# Patient Record
Sex: Male | Born: 1935 | Race: White | Hispanic: No | Smoking: Never smoker
Health system: Southern US, Community
[De-identification: ages and names within clinical notes are randomized; demographics above are authoritative.]

## PROBLEM LIST (undated history)

## (undated) DIAGNOSIS — E119 Type 2 diabetes mellitus without complications: Secondary | ICD-10-CM

## (undated) DIAGNOSIS — I10 Essential (primary) hypertension: Secondary | ICD-10-CM

## (undated) DIAGNOSIS — M109 Gout, unspecified: Secondary | ICD-10-CM

## (undated) DIAGNOSIS — N189 Chronic kidney disease, unspecified: Secondary | ICD-10-CM

## (undated) HISTORY — PX: EYE SURGERY: SHX253

---

## 2013-03-29 ENCOUNTER — Encounter (HOSPITAL_COMMUNITY): Payer: Self-pay | Admitting: Emergency Medicine

## 2013-03-29 ENCOUNTER — Emergency Department (HOSPITAL_COMMUNITY): Payer: Medicare Other

## 2013-03-29 ENCOUNTER — Inpatient Hospital Stay (HOSPITAL_COMMUNITY)
Admission: EM | Admit: 2013-03-29 | Discharge: 2013-04-01 | DRG: 638 | Disposition: A | Discharge status: Home / Self Care | Payer: Medicare Other | Attending: Internal Medicine | Admitting: Internal Medicine

## 2013-03-29 DIAGNOSIS — M9080 Osteopathy in diseases classified elsewhere, unspecified site: Secondary | ICD-10-CM | POA: Diagnosis present

## 2013-03-29 DIAGNOSIS — M869 Osteomyelitis, unspecified: Secondary | ICD-10-CM

## 2013-03-29 DIAGNOSIS — E1169 Type 2 diabetes mellitus with other specified complication: Principal | ICD-10-CM | POA: Diagnosis present

## 2013-03-29 DIAGNOSIS — D649 Anemia, unspecified: Secondary | ICD-10-CM

## 2013-03-29 DIAGNOSIS — I10 Essential (primary) hypertension: Secondary | ICD-10-CM

## 2013-03-29 DIAGNOSIS — Z66 Do not resuscitate: Secondary | ICD-10-CM | POA: Diagnosis present

## 2013-03-29 DIAGNOSIS — I129 Hypertensive chronic kidney disease with stage 1 through stage 4 chronic kidney disease, or unspecified chronic kidney disease: Secondary | ICD-10-CM | POA: Diagnosis present

## 2013-03-29 DIAGNOSIS — D72829 Elevated white blood cell count, unspecified: Secondary | ICD-10-CM

## 2013-03-29 DIAGNOSIS — N183 Chronic kidney disease, stage 3 unspecified: Secondary | ICD-10-CM

## 2013-03-29 DIAGNOSIS — T380X5A Adverse effect of glucocorticoids and synthetic analogues, initial encounter: Secondary | ICD-10-CM | POA: Diagnosis present

## 2013-03-29 DIAGNOSIS — M7989 Other specified soft tissue disorders: Secondary | ICD-10-CM | POA: Diagnosis present

## 2013-03-29 DIAGNOSIS — M109 Gout, unspecified: Secondary | ICD-10-CM

## 2013-03-29 HISTORY — DX: Type 2 diabetes mellitus without complications: E11.9

## 2013-03-29 HISTORY — DX: Gout, unspecified: M10.9

## 2013-03-29 HISTORY — DX: Essential (primary) hypertension: I10

## 2013-03-29 HISTORY — DX: Chronic kidney disease, unspecified: N18.9

## 2013-03-29 LAB — CBC WITH DIFFERENTIAL/PLATELET
Basophils Absolute: 0.1 10*3/uL (ref 0.0–0.1)
Basophils Relative: 1 % (ref 0–1)
Eosinophils Absolute: 0.3 10*3/uL (ref 0.0–0.7)
Eosinophils Relative: 4 % (ref 0–5)
HCT: 35.4 % — ABNORMAL LOW (ref 39.0–52.0)
Hemoglobin: 12.1 g/dL — ABNORMAL LOW (ref 13.0–17.0)
Lymphocytes Relative: 21 % (ref 12–46)
Lymphs Abs: 2.1 10*3/uL (ref 0.7–4.0)
MCH: 32.2 pg (ref 26.0–34.0)
MCHC: 34.2 g/dL (ref 30.0–36.0)
MCV: 94.1 fL (ref 78.0–100.0)
Monocytes Absolute: 0.9 10*3/uL (ref 0.1–1.0)
Monocytes Relative: 9 % (ref 3–12)
NEUTROS ABS: 6.4 10*3/uL (ref 1.7–7.7)
Neutrophils Relative %: 65 % (ref 43–77)
PLATELETS: 254 10*3/uL (ref 150–400)
RBC: 3.76 MIL/uL — AB (ref 4.22–5.81)
RDW: 14.8 % (ref 11.5–15.5)
WBC: 9.8 10*3/uL (ref 4.0–10.5)

## 2013-03-29 LAB — BASIC METABOLIC PANEL
BUN: 32 mg/dL — ABNORMAL HIGH (ref 6–23)
CHLORIDE: 102 meq/L (ref 96–112)
CO2: 22 mEq/L (ref 19–32)
Calcium: 9.4 mg/dL (ref 8.4–10.5)
Creatinine, Ser: 1.42 mg/dL — ABNORMAL HIGH (ref 0.50–1.35)
GFR calc Af Amer: 51 mL/min — ABNORMAL LOW (ref >90)
GFR, EST NON AFRICAN AMERICAN: 44 mL/min — AB (ref >90)
GLUCOSE: 202 mg/dL — AB (ref 70–99)
POTASSIUM: 3.5 meq/L — AB (ref 3.7–5.3)
SODIUM: 140 meq/L (ref 137–147)

## 2013-03-29 MED ORDER — VANCOMYCIN HCL 10 G IV SOLR
1500.0000 mg | INTRAVENOUS | Status: AC
  Administered 2013-03-30: 1500 mg via INTRAVENOUS
  Filled 2013-03-29: qty 1500

## 2013-03-29 MED ORDER — ONDANSETRON HCL 4 MG/2ML IJ SOLN
4.0000 mg | Freq: Four times a day (QID) | INTRAMUSCULAR | Status: DC | PRN
  Administered 2013-03-30 – 2013-03-31 (×2): 4 mg via INTRAVENOUS
  Filled 2013-03-29 (×2): qty 2

## 2013-03-29 MED ORDER — VANCOMYCIN HCL 10 G IV SOLR
1500.0000 mg | Freq: Once | INTRAVENOUS | Status: DC
  Filled 2013-03-29: qty 1500

## 2013-03-29 MED ORDER — HYDROCODONE-ACETAMINOPHEN 5-325 MG PO TABS
1.0000 | ORAL_TABLET | ORAL | Status: DC | PRN
  Administered 2013-03-30 (×2): 2 via ORAL
  Administered 2013-03-30: 1 via ORAL
  Administered 2013-03-30 – 2013-03-31 (×5): 2 via ORAL
  Administered 2013-03-31 – 2013-04-01 (×2): 1 via ORAL
  Filled 2013-03-29 (×4): qty 2
  Filled 2013-03-29: qty 1
  Filled 2013-03-29 (×4): qty 2
  Filled 2013-03-29: qty 1

## 2013-03-29 MED ORDER — HEPARIN SODIUM (PORCINE) 5000 UNIT/ML IJ SOLN
5000.0000 [IU] | Freq: Three times a day (TID) | INTRAMUSCULAR | Status: DC
Start: 2013-03-30 — End: 2013-04-01
  Administered 2013-03-30 – 2013-04-01 (×7): 5000 [IU] via SUBCUTANEOUS
  Filled 2013-03-29 (×11): qty 1

## 2013-03-29 MED ORDER — PROPRANOLOL HCL 20 MG PO TABS
20.0000 mg | ORAL_TABLET | Freq: Two times a day (BID) | ORAL | Status: DC
  Administered 2013-03-30 – 2013-04-01 (×5): 20 mg via ORAL
  Filled 2013-03-29 (×8): qty 1

## 2013-03-29 MED ORDER — ONDANSETRON HCL 4 MG PO TABS
4.0000 mg | ORAL_TABLET | Freq: Four times a day (QID) | ORAL | Status: DC | PRN
  Administered 2013-03-31 – 2013-04-01 (×3): 4 mg via ORAL
  Filled 2013-03-29 (×3): qty 1

## 2013-03-29 MED ORDER — DEXTROSE 5 % IV SOLN
2.0000 g | Freq: Once | INTRAVENOUS | Status: DC
  Filled 2013-03-29: qty 2

## 2013-03-29 MED ORDER — VANCOMYCIN HCL 10 G IV SOLR
1500.0000 mg | INTRAVENOUS | Status: DC
  Filled 2013-03-29 (×2): qty 1500

## 2013-03-29 NOTE — ED Provider Notes (Signed)
CSN: [id number]     Arrival date & time 03/29/13  1600 History   First MD Initiated Contact with Patient 03/29/13 2032     Chief Complaint  Patient presents with  . Foot Pain     (Consider location/radiation/quality/duration/timing/severity/associated sxs/prior Treatment) Patient is a 80 y.o. male presenting with lower extremity pain. The history is provided by the patient.  Foot Pain This is a new problem. Episode onset: 2-3 weeks ago. The problem occurs constantly. The problem has not changed since onset.Pertinent negatives include no chest pain, no abdominal pain, no headaches and no shortness of breath. Exacerbated by: hit the 2nd digit of his right foot several weeks ago. Nothing relieves the symptoms. He has tried nothing for the symptoms. The treatment provided no relief.    Past Medical History  Diagnosis Date  . Gout   . Hypertension    History reviewed. No pertinent past surgical history. History reviewed. No pertinent family history. History  Substance Use Topics  . Smoking status: Never Smoker   . Smokeless tobacco: Current User    Types: Chew  . Alcohol Use: Yes    Review of Systems  Constitutional: Negative for fever.  HENT: Negative for drooling and rhinorrhea.   Eyes: Negative for pain.  Respiratory: Negative for cough and shortness of breath.   Cardiovascular: Negative for chest pain and leg swelling.  Gastrointestinal: Negative for nausea, vomiting, abdominal pain and diarrhea.  Genitourinary: Negative for dysuria and hematuria.  Musculoskeletal: Negative for gait problem and neck pain.  Skin: Negative for color change.  Neurological: Negative for numbness and headaches.  Hematological: Negative for adenopathy.  Psychiatric/Behavioral: Negative for behavioral problems.  All other systems reviewed and are negative.      Allergies  Darvon  Home Medications   Current Outpatient Rx  Name  Route  Sig  Dispense  Refill  . indomethacin (INDOCIN) 25 MG  capsule   Oral   Take 25 mg by mouth 3 (three) times daily as needed (gout pain.).         Marland Kitchen lisinopril-hydrochlorothiazide (PRINZIDE,ZESTORETIC) 20-25 MG per tablet   Oral   Take 1 tablet by mouth daily.         . Misc Natural Products (TART CHERRY ADVANCED) CAPS   Oral   Take 1 capsule by mouth daily.         . propranolol (INDERAL) 20 MG tablet   Oral   Take 20 mg by mouth 2 (two) times daily.          BP 140/65  Pulse 69  Temp(Src) 98.5 F (36.9 C) (Oral)  Resp 18  SpO2 100% Physical Exam  Nursing note and vitals reviewed. Constitutional: He is oriented to person, place, and time. He appears well-developed and well-nourished.  HENT:  Head: Normocephalic and atraumatic.  Right Ear: External ear normal.  Left Ear: External ear normal.  Nose: Nose normal.  Mouth/Throat: Oropharynx is clear and moist. No oropharyngeal exudate.  Eyes: Conjunctivae and EOM are normal. Pupils are equal, round, and reactive to light.  Neck: Normal range of motion. Neck supple.  Cardiovascular: Normal rate, regular rhythm, normal heart sounds and intact distal pulses.  Exam reveals no gallop and no friction rub.   No murmur heard. Pulmonary/Chest: Effort normal and breath sounds normal. No respiratory distress. He has no wheezes.  Abdominal: Soft. Bowel sounds are normal. He exhibits no distension. There is no tenderness. There is no rebound and no guarding.  Musculoskeletal: Normal range of motion.  He exhibits no edema and no tenderness.  Diffuse erythema and swelling of the second digit on the right foot. A small punctate wound is noted on the right lateral aspect of the toe with mild purulent drainage.  Neurological: He is alert and oriented to person, place, and time.  Skin: Skin is warm and dry.  Psychiatric: He has a normal mood and affect. His behavior is normal.    ED Course  Procedures (including critical care time) Labs Review Labs Reviewed  CBC WITH DIFFERENTIAL -  Abnormal; Notable for the following:    RBC 3.76 (*)    Hemoglobin 12.1 (*)    HCT 35.4 (*)    All other components within normal limits  BASIC METABOLIC PANEL - Abnormal; Notable for the following:    Potassium 3.5 (*)    Glucose, Bld 202 (*)    BUN 32 (*)    Creatinine, Ser 1.42 (*)    GFR calc non Af Amer 44 (*)    GFR calc Af Amer 51 (*)    All other components within normal limits  SEDIMENTATION RATE - Abnormal; Notable for the following:    Sed Rate 66 (*)    All other components within normal limits  C-REACTIVE PROTEIN - Abnormal; Notable for the following:    CRP 2.9 (*)    All other components within normal limits  COMPREHENSIVE METABOLIC PANEL - Abnormal; Notable for the following:    Glucose, Bld 165 (*)    BUN 27 (*)    Albumin 3.2 (*)    GFR calc non Af Amer 49 (*)    GFR calc Af Amer 56 (*)    All other components within normal limits  CBC - Abnormal; Notable for the following:    WBC 11.1 (*)    RBC 4.04 (*)    Hemoglobin 12.7 (*)    HCT 37.9 (*)    All other components within normal limits  HEMOGLOBIN A1C - Abnormal; Notable for the following:    Hemoglobin A1C 6.1 (*)    Mean Plasma Glucose 128 (*)    All other components within normal limits  GLUCOSE, CAPILLARY - Abnormal; Notable for the following:    Glucose-Capillary 145 (*)    All other components within normal limits  CULTURE, BLOOD (ROUTINE X 2)  CULTURE, BLOOD (ROUTINE X 2)  PROTIME-INR   Imaging Review Dg Foot Complete Right  03/29/2013   CLINICAL DATA:  Trauma 2-3 weeks ago with bruising and swelling to the second toe  EXAM: RIGHT FOOT COMPLETE - 3+ VIEW  COMPARISON:  None.  FINDINGS: Destruction of the head of the second toe proximal phalanx is identified with internal fracture lines evident. Fluffy ossification/ calcification overlying this area is identified. Extensive vascular calcifications are noted. Periarticular erosions with bony overgrowth noted predominantly at the first  metatarsal-phalangeal joint. No radiopaque foreign body. Bones are subjectively osteopenic.  IMPRESSION: Osseous destruction at the right second PIP joint with fracture lines evident and findings highly suggestive of osteomyelitis.  Periarticular erosions most prominent at the right first metatarsal phalangeal joint as well as the first tarsometatarsal joint. This could be related to gout, neuropathic arthropathy, or multi focal osteomyelitis.   Electronically Signed   By: Christiana PellantGretchen  Green M.D.   On: 03/29/2013 18:07      MDM   Final diagnoses:  Osteomyelitis    9:07 PM 80 y.o. male who presents with toe pain which began several weeks ago. The patient believes that he may  have stubbed his toe at that time but does not remember any specific injury. He has noted purulent drainage from the last week with continued pain. He denies any fevers or other associated symptoms. He is afebrile and vital signs are unremarkable here. Will get screening labwork.  Plain film c/w osteomyelitis. Discussed w/ on call ortho. Will give IV abx and admit to [facility].     Junius Argyle, MD 03/31/13 1134

## 2013-03-29 NOTE — ED Notes (Signed)
Labs collected with IV start

## 2013-03-29 NOTE — ED Notes (Addendum)
Pt states he has had R toe pain for past 2 weeks since he bumped toe next to great toe. Family states she found out yesterday about toe. Went to [facility] this afternoon and was told to come here for eval. Pt's toe reddened and swollen. Cap refill under 2 seconds. Pt states pain has decreased since it began draining a few days ago

## 2013-03-29 NOTE — ED Notes (Signed)
Attempted to call report nurse unavailable, states she will call back.

## 2013-03-29 NOTE — Progress Notes (Signed)
   CARE MANAGEMENT ED NOTE 03/29/2013  Patient:  Frank [name],Frank [name]   Account Number:  [id number]  Date Initiated:  03/29/2013  Documentation initiated by:  Radford PaxFERRERO,Shadi Larner  Subjective/Objective Assessment:   Patient presents to Ed with right toe pain     Subjective/Objective Assessment Detail:   Patient with pmhx for gout anf HTN     Action/Plan:   Action/Plan Detail:   Anticipated DC Date:       Status Recommendation to Physician:   Result of Recommendation:    Other ED Services  Consult Working Plan    DC Planning Services  Other  PCP issues    Choice offered to / List presented to:            Status of service:  Completed, signed off  ED Comments:   ED Comments Detail:  EDCM spoke to patient and his daughter at bedside.  As per patient, [place] System updated.  Patient is currently staying with his daughter.

## 2013-03-29 NOTE — H&P (Signed)
[facility] History and Physical  Patient: Frank [name]  HFW:[id number]  DOB: 01-25-1928  DOS: the patient was seen and examined on 03/29/2013 PCP: Jobe Marker, MD  Chief Complaint: Swelling of left second toe with discharge  HPI: Frank [name] is a 80 y.o. male with Past medical history of gout, hypertension, chronic kidney disease. The patient is coming from home. The patient was brought in by his daughter, as per the patient he has been having complaints of pain in his second toe since last 3 weeks. Since last one week she has noted white-yellow discharge coming out of the second toe. He initially felt that this was his routine gout attack and took his ibuprofen. Few days ago he had bumped his toe and since then he has noted this discharge. He complains of generalized fatigue and tiredness but denies any fever, chills,  headache, cough, chest pain, palpitation, shortness of breath, orthopnea, PND, nausea, vomiting, abdominal pain, diarrhea, constipation, active bleeding, burning urination, dizziness, pedal edema,  focal neurological deficit. He denies any prior history of systemic infections or long-term antibiotics.   Review of Systems: as mentioned in the history of present illness.  A Comprehensive review of the other systems is negative.  Past Medical History  Diagnosis Date  . Gout   . Hypertension    History reviewed. No pertinent past surgical history. Social History:  reports that he has never smoked. His smokeless tobacco use includes Chew. He reports that he drinks alcohol. He reports that he does not use illicit drugs. Independent for most of his  ADL.  Allergies  Allergen Reactions  . Darvon [Propoxyphene] Swelling    History reviewed. No pertinent family history.  Prior to Admission medications   Medication Sig Start Date End Date Taking? Authorizing Provider  indomethacin (INDOCIN) 25 MG capsule Take 25 mg by mouth 3 (three) times daily as needed (gout  pain.).   Yes Historical Provider, MD  lisinopril-hydrochlorothiazide (PRINZIDE,ZESTORETIC) 20-25 MG per tablet Take 1 tablet by mouth daily.   Yes Historical Provider, MD  Misc Natural Products (TART CHERRY ADVANCED) CAPS Take 1 capsule by mouth daily.   Yes Historical Provider, MD  propranolol (INDERAL) 20 MG tablet Take 20 mg by mouth 2 (two) times daily.   Yes Historical Provider, MD    Physical Exam: Filed Vitals:   03/29/13 1644 03/29/13 2121  BP: 140/65   Pulse: 69   Temp: 98.5 F (36.9 C)   TempSrc: Oral   Resp: 18   Height:  6' (1.829 m)  Weight:  88.451 kg (195 lb)  SpO2: 100%     General: Alert, Awake and Oriented to Time, Place and Person. Appear in mild distress Eyes: PERRL ENT: Oral Mucosa clear moist. Neck: No JVD Cardiovascular: S1 and S2 Present, no Murmur, Peripheral Pulses Present Respiratory: Bilateral Air entry equal and Decreased, Clear to Auscultation,  No Crackles, no wheezes Abdomen: Bowel Sound Present, Soft and Non tender Skin: No Rash Extremities: No Pedal edema, no calf tenderness, left second toe fusiform swelling with redness and warmth with some swelling on the dorsum of the foot. Peripheral pulses are palpable. Sensation is intact Neurologic: Grossly Unremarkable. Labs on Admission:  CBC:  Recent Labs Lab 03/29/13 2119  WBC 9.8  NEUTROABS 6.4  HGB 12.1*  HCT 35.4*  MCV 94.1  PLT 254    CMP     Component Value Date/Time   NA 140 03/29/2013 2119   K 3.5* 03/29/2013 2119   CL 102 03/29/2013 2119  CO2 22 03/29/2013 2119   GLUCOSE 202* 03/29/2013 2119   BUN 32* 03/29/2013 2119   CREATININE 1.42* 03/29/2013 2119   CALCIUM 9.4 03/29/2013 2119   GFRNONAA 44* 03/29/2013 2119   GFRAA 51* 03/29/2013 2119    No results found for this basename: LIPASE, AMYLASE,  in the last 168 hours No results found for this basename: AMMONIA,  in the last 168 hours  No results found for this basename: CKTOTAL, CKMB, CKMBINDEX, TROPONINI,  in the last 168 hours BNP  (last 3 results) No results found for this basename: PROBNP,  in the last 8760 hours  Radiological Exams on Admission: Dg Foot Complete Right  03/29/2013   CLINICAL DATA:  Trauma 2-3 weeks ago with bruising and swelling to the second toe  EXAM: RIGHT FOOT COMPLETE - 3+ VIEW  COMPARISON:  None.  FINDINGS: Destruction of the head of the second toe proximal phalanx is identified with internal fracture lines evident. Fluffy ossification/ calcification overlying this area is identified. Extensive vascular calcifications are noted. Periarticular erosions with bony overgrowth noted predominantly at the first metatarsal-phalangeal joint. No radiopaque foreign body. Bones are subjectively osteopenic.  IMPRESSION: Osseous destruction at the right second PIP joint with fracture lines evident and findings highly suggestive of osteomyelitis.  Periarticular erosions most prominent at the right first metatarsal phalangeal joint as well as the first tarsometatarsal joint. This could be related to gout, neuropathic arthropathy, or multi focal osteomyelitis.   Electronically Signed   By: Conchita Paris M.D.   On: 03/29/2013 18:07   Assessment/Plan Principal Problem:   Osteomyelitis Active Problems:   Gout   Hypertension   1. Osteomyelitis The patient is presenting with complaints of left second toe discharge and swelling. This has been ongoing since last 3 weeks. X-ray of the foot is suggestive of osteomyelitis. At present I would admit him to the hospital, start him on IV vancomycin, follow blood cultures, orthopedic has been consulted we'll follow recommendation. We will also get ESR CRP. Further workup as per orthopedic.  2. History of gout It appears the patient has history of gout and current condition appears secondarily infected gout joint At present he does not appear to have active gout continue to monitor  3. Hypertension Holding his diuretics and continuing propranolol as home dose  Consults:  Orthopedics  DVT Prophylaxis: subcutaneous Heparin Nutrition: Cardiac diet  Code Status: DNR/DNI  Family Communication: Daughter was present at bedside, opportunity was given to ask question and all questions were answered satisfactorily at the time of interview. Disposition: Admitted to observation in med-surge unit.  Author: Berle Mull, MD [facility] Pager: [phone] 03/29/2013, 11:13 PM    If 7PM-7AM, please contact night-coverage [email or web address] Password [email or web address]

## 2013-03-30 ENCOUNTER — Encounter (HOSPITAL_COMMUNITY)
Admission: EM | Disposition: A | Discharge status: Home / Self Care | Payer: Self-pay | Source: Home / Self Care | Attending: Internal Medicine

## 2013-03-30 ENCOUNTER — Encounter (HOSPITAL_COMMUNITY): Payer: Self-pay | Admitting: *Deleted

## 2013-03-30 DIAGNOSIS — I10 Essential (primary) hypertension: Secondary | ICD-10-CM | POA: Diagnosis present

## 2013-03-30 DIAGNOSIS — M109 Gout, unspecified: Secondary | ICD-10-CM | POA: Diagnosis present

## 2013-03-30 LAB — HEMOGLOBIN A1C
HEMOGLOBIN A1C: 6.1 % — AB (ref <5.7)
Mean Plasma Glucose: 128 mg/dL — ABNORMAL HIGH (ref <117)

## 2013-03-30 LAB — COMPREHENSIVE METABOLIC PANEL WITH GFR
ALT: 11 U/L (ref 0–53)
AST: 15 U/L (ref 0–37)
Albumin: 3.2 g/dL — ABNORMAL LOW (ref 3.5–5.2)
Alkaline Phosphatase: 75 U/L (ref 39–117)
BUN: 27 mg/dL — ABNORMAL HIGH (ref 6–23)
CO2: 20 meq/L (ref 19–32)
Calcium: 9.2 mg/dL (ref 8.4–10.5)
Chloride: 103 meq/L (ref 96–112)
Creatinine, Ser: 1.31 mg/dL (ref 0.50–1.35)
GFR calc Af Amer: 56 mL/min — ABNORMAL LOW
GFR calc non Af Amer: 49 mL/min — ABNORMAL LOW
Glucose, Bld: 165 mg/dL — ABNORMAL HIGH (ref 70–99)
Potassium: 3.7 meq/L (ref 3.7–5.3)
Sodium: 140 meq/L (ref 137–147)
Total Bilirubin: 0.7 mg/dL (ref 0.3–1.2)
Total Protein: 6.5 g/dL (ref 6.0–8.3)

## 2013-03-30 LAB — CBC
HCT: 37.9 % — ABNORMAL LOW (ref 39.0–52.0)
Hemoglobin: 12.7 g/dL — ABNORMAL LOW (ref 13.0–17.0)
MCH: 31.4 pg (ref 26.0–34.0)
MCHC: 33.5 g/dL (ref 30.0–36.0)
MCV: 93.8 fL (ref 78.0–100.0)
Platelets: 272 10*3/uL (ref 150–400)
RBC: 4.04 MIL/uL — ABNORMAL LOW (ref 4.22–5.81)
RDW: 14.8 % (ref 11.5–15.5)
WBC: 11.1 10*3/uL — ABNORMAL HIGH (ref 4.0–10.5)

## 2013-03-30 LAB — C-REACTIVE PROTEIN: CRP: 2.9 mg/dL — ABNORMAL HIGH (ref <0.60)

## 2013-03-30 LAB — PROTIME-INR
INR: 0.98 (ref 0.00–1.49)
Prothrombin Time: 12.8 seconds (ref 11.6–15.2)

## 2013-03-30 LAB — SEDIMENTATION RATE: Sed Rate: 66 mm/hr — ABNORMAL HIGH (ref 0–16)

## 2013-03-30 SURGERY — AMPUTATION DIGIT
Anesthesia: General | Laterality: Right

## 2013-03-30 MED ORDER — SODIUM CHLORIDE 0.9 % IV SOLN
INTRAVENOUS | Status: DC
  Administered 2013-03-30: 09:00:00 via INTRAVENOUS

## 2013-03-30 MED ORDER — DOXYCYCLINE HYCLATE 100 MG PO TABS
100.0000 mg | ORAL_TABLET | Freq: Two times a day (BID) | ORAL | Status: DC
  Administered 2013-03-31 – 2013-04-01 (×3): 100 mg via ORAL
  Filled 2013-03-30 (×4): qty 1

## 2013-03-30 MED ORDER — POLYETHYLENE GLYCOL 3350 17 G PO PACK: 17.0000 g | PACK | Freq: Every day | ORAL | Status: DC | PRN

## 2013-03-30 MED ORDER — INSULIN ASPART 100 UNIT/ML ~~LOC~~ SOLN
0.0000 [IU] | Freq: Three times a day (TID) | SUBCUTANEOUS | Status: DC
  Administered 2013-03-31: 3 [IU] via SUBCUTANEOUS
  Administered 2013-04-01 (×2): 2 [IU] via SUBCUTANEOUS

## 2013-03-30 MED ORDER — PREDNISONE 20 MG PO TABS
40.0000 mg | ORAL_TABLET | Freq: Every day | ORAL | Status: DC
  Administered 2013-03-31 – 2013-04-01 (×2): 40 mg via ORAL
  Filled 2013-03-30 (×4): qty 2

## 2013-03-30 MED ORDER — PNEUMOCOCCAL VAC POLYVALENT 25 MCG/0.5ML IJ INJ
0.5000 mL | INJECTION | INTRAMUSCULAR | Status: DC
  Filled 2013-03-30 (×2): qty 0.5

## 2013-03-30 MED ORDER — VANCOMYCIN HCL IN DEXTROSE 1-5 GM/200ML-% IV SOLN: 1000.0000 mg | INTRAVENOUS | Status: DC

## 2013-03-30 MED ORDER — VANCOMYCIN HCL IN DEXTROSE 750-5 MG/150ML-% IV SOLN
750.0000 mg | Freq: Two times a day (BID) | INTRAVENOUS | Status: DC
  Filled 2013-03-30: qty 150

## 2013-03-30 NOTE — Progress Notes (Signed)
Pharmacy: Brief Vancomycin Note  D#1 Vancomycin for OM toe.  Patients renal function mproved today with SCr 1.31 (1.42 last night) and normalized CrCl 36 ml/min will adjust vancomycin dose.  2/10 blood cultures pending.    Abx Plan 1.) Change Vancomycin to 1000 mg IV q24h, next dose due 2/11 at 0200  2.) Monitor renal function, will check BMET in am 3.) Vancomycin trough at Css 4.) f/u cultures   Lalonnie Shaffer, Loma MessingMary Patricia PharmD Pager #: [phone] 1:26 PM 03/30/2013

## 2013-03-30 NOTE — Progress Notes (Signed)
Called ED and got report from Amy, RN.

## 2013-03-30 NOTE — Progress Notes (Signed)
PROGRESS NOTE    Frank [name] ESP:[id number] DOB: [birth year] DOA: 03/29/2013 PCP: Frank Marker, MD  HPI/Brief narrative 80 year old male with history of gout, hypertension, chronic kidney disease presented from home with complaints of pain in his right second toe of 3 weeks duration associated with whitish yellow discharge. He thought this was his routine gout attack and took ibuprofen. Subsequently he bumped his toe and then noted the discharge. X-ray of the foot was suggestive of osteomyelitis and patient was admitted for further management.  Assessment/Plan:  Right second toe acute gouty arthritis versus infected Gout - Orthopedic input appreciated. Recommend conservatively with antibiotics and gout medications followed by outpatient followup for further evaluation. - Continue IV vancomycin for tonight in addition to oral prednisone. May switch to oral doxycycline and discharged on home in a.m. on short course of prednisone. Outpatient followup with orthopedics in one week. - ESR 66  Hypertension - Controlled  Chronic kidney disease - Stable.   Code Status: DO NOT RESUSCITATE Family Communication: None at bedside Disposition Plan: Home possibly tomorrow   Consultants:  Orthopedics  Procedures:  None  Antibiotics:  IV vancomycin   Subjective: Right second toe pain-slightly better than yesterday.  Objective: Filed Vitals:   03/30/13 0100 03/30/13 0550 03/30/13 0909 03/30/13 1400  BP:  109/66 123/59 121/60  Pulse: 68 94 96 63  Temp:  98.9 F (37.2 C) 99.4 F (37.4 C) 98.5 F (36.9 C)  TempSrc:  Oral Oral Oral  Resp:  _0 Height:      Weight:      SpO2:  94% 96% 96%    Intake/Output Summary (Last 24 hours) at 03/30/13 1837 Last data filed at 03/30/13 0550  Gross per 24 hour  Intake   1005 ml  Output      0 ml  Net   1005 ml   Filed Weights   03/29/13 2121 03/30/13 0035  Weight: 88.451 kg (195 lb) 88.451 kg (195 lb)     Exam:  General  exam: Elderly male lying comfortably in bed. Respiratory system: Clear. No increased work of breathing. Cardiovascular system: S1 & S2 heard, RRR. No JVD, murmurs, gallops, clicks or pedal edema. Gastrointestinal system: Abdomen is nondistended, soft and nontender. Normal bowel sounds heard. Central nervous system: Alert and oriented. No focal neurological deficits. Extremities: Symmetric 5 x 5 power. Right second toe diffusely swollen, slightly warm, tender and extruding minimal chalky white material.   Data Reviewed: Basic Metabolic Panel:  Recent Labs Lab 03/29/13 2119 03/30/13 0440  NA 140 140  K 3.5* 3.7  CL 102 103  CO2 22 20  GLUCOSE 202* 165*  BUN 32* 27*  CREATININE 1.42* 1.31  CALCIUM 9.4 9.2   Liver Function Tests:  Recent Labs Lab 03/30/13 0440  AST 15  ALT 11  ALKPHOS 75  BILITOT 0.7  PROT 6.5  ALBUMIN 3.2*   No results found for this basename: LIPASE, AMYLASE,  in the last 168 hours No results found for this basename: AMMONIA,  in the last 168 hours CBC:  Recent Labs Lab 03/29/13 2119 03/30/13 0440  WBC 9.8 11.1*  NEUTROABS 6.4  --   HGB 12.1* 12.7*  HCT 35.4* 37.9*  MCV 94.1 93.8  PLT 254 272   Cardiac Enzymes: No results found for this basename: CKTOTAL, CKMB, CKMBINDEX, TROPONINI,  in the last 168 hours BNP (last 3 results) No results found for this basename: PROBNP,  in the last 8760 hours CBG: No results found for  this basename: GLUCAP,  in the last 168 hours  No results found for this or any previous visit (from the past 240 hour(s)).    Studies: Dg Foot Complete Right  2013-03-30   CLINICAL DATA:  Trauma 2-3 weeks ago with bruising and swelling to the second toe  EXAM: RIGHT FOOT COMPLETE - 3+ VIEW  COMPARISON:  None.  FINDINGS: Destruction of the head of the second toe proximal phalanx is identified with internal fracture lines evident. Fluffy ossification/ calcification overlying this area is identified. Extensive vascular  calcifications are noted. Periarticular erosions with bony overgrowth noted predominantly at the first metatarsal-phalangeal joint. No radiopaque foreign body. Bones are subjectively osteopenic.  IMPRESSION: Osseous destruction at the right second PIP joint with fracture lines evident and findings highly suggestive of osteomyelitis.  Periarticular erosions most prominent at the right first metatarsal phalangeal joint as well as the first tarsometatarsal joint. This could be related to gout, neuropathic arthropathy, or multi focal osteomyelitis.   Electronically Signed   By: Conchita Paris M.D.   On: 2013/03/30 18:07        Scheduled Meds: . heparin  5,000 Units Subcutaneous Q8H  . [START ON 03/31/2013] pneumococcal 23 valent vaccine  0.5 mL Intramuscular Tomorrow-1000  . propranolol  20 mg Oral BID  . [START ON 03/31/2013] vancomycin  1,000 mg Intravenous Q24H   Continuous Infusions: . sodium chloride 100 mL/hr at 03/30/13 4445    Principal Problem:   Osteomyelitis Active Problems:   Gout   Hypertension    Time spent: 65 minutes    Frank Hands, MD, FACP, FHM. [facility] Pager [phone]  If 7PM-7AM, please contact night-coverage [email or web address] Password [facility] 03/30/2013, 6:37 PM    LOS: 1 day

## 2013-03-30 NOTE — Treatment Plan (Signed)
Will plan to medically optimize patient with gout meds and abx before undergoing surgery. May dc home and follow up with me in 1 week for clinical re check. Does ultimately need toe amputation with the amount of bony destruction but does not necessarily need surgery acutely.  Mayra ReelN. Michael Xu, MD [facility] [phone] 2:10 PM

## 2013-03-30 NOTE — Progress Notes (Signed)
UR completed. Patient changed to inpatient- requiring IV antibiotics for osteomyelitis.

## 2013-03-30 NOTE — Consult Note (Signed)
ORTHOPAEDIC CONSULTATION  REQUESTING PHYSICIAN: Elease Etienne, MD  Chief Complaint: Toe swelling and drainage  HPI: Frank [name] is a 80 y.o. male who complains of right second toe swelling, pain, drainage for a couple of weeks.  Denies any constitutional symptoms.  Has h/o gout.  Daughter reports white cottage cheese like drainage from lateral wound.  Denies DM or peripheral neuropathy.  Past Medical History  Diagnosis Date  . Gout   . Hypertension   . Gout   . Diabetes mellitus without complication     borderline a few years but has lost weight  . Chronic kidney disease    Past Surgical History  Procedure Laterality Date  . Eye surgery Bilateral     cataracts   History   Social History  . Marital Status: Widowed    Spouse Name: N/A    Number of Children: N/A  . Years of Education: N/A   Social History Main Topics  . Smoking status: Never Smoker   . Smokeless tobacco: Current User    Types: Chew  . Alcohol Use: Yes     Comment: vodka rarely, last on 2/9  . Drug Use: No  . Sexual Activity: No   Other Topics Concern  . None   Social History Narrative  . None   Family History  Problem Relation Age of Onset  . Cancer Brother    Allergies  Allergen Reactions  . Darvon [Propoxyphene] Swelling   Prior to Admission medications   Medication Sig Start Date End Date Taking? Authorizing Provider  indomethacin (INDOCIN) 25 MG capsule Take 25 mg by mouth 3 (three) times daily as needed (gout pain.).   Yes Historical Provider, MD  lisinopril-hydrochlorothiazide (PRINZIDE,ZESTORETIC) 20-25 MG per tablet Take 1 tablet by mouth daily.   Yes Historical Provider, MD  Misc Natural Products (TART CHERRY ADVANCED) CAPS Take 1 capsule by mouth daily.   Yes Historical Provider, MD  propranolol (INDERAL) 20 MG tablet Take 20 mg by mouth 2 (two) times daily.   Yes Historical Provider, MD   Dg Foot Complete Right  03/29/2013   CLINICAL DATA:  Trauma 2-3 weeks ago with  bruising and swelling to the second toe  EXAM: RIGHT FOOT COMPLETE - 3+ VIEW  COMPARISON:  None.  FINDINGS: Destruction of the head of the second toe proximal phalanx is identified with internal fracture lines evident. Fluffy ossification/ calcification overlying this area is identified. Extensive vascular calcifications are noted. Periarticular erosions with bony overgrowth noted predominantly at the first metatarsal-phalangeal joint. No radiopaque foreign body. Bones are subjectively osteopenic.  IMPRESSION: Osseous destruction at the right second PIP joint with fracture lines evident and findings highly suggestive of osteomyelitis.  Periarticular erosions most prominent at the right first metatarsal phalangeal joint as well as the first tarsometatarsal joint. This could be related to gout, neuropathic arthropathy, or multi focal osteomyelitis.   Electronically Signed   By: Christiana Pellant M.D.   On: 03/29/2013 18:07    Positive ROS: All other systems have been reviewed and were otherwise negative with the exception of those mentioned in the HPI and as above.  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 Infected gout  Plan: - discussed findings with patient and daughter - with destruction of bony architecture and possibly infected gouty toe, would recommend surgical treatment - discussed I&D vs. Toe amp - would recommend toe amp - NPO - plan for surgery this pm - r/b/a discussed with  patient and daughter  Thank you for the consult and the opportunity to see Frank [name]  N. Glee ArvinMichael Xu, MD [facility] [phone] 9:21 AM

## 2013-03-30 NOTE — Progress Notes (Signed)
ANTIBIOTIC CONSULT NOTE - INITIAL  Pharmacy Consult for vancomycin Indication: Osteomyelitis  Allergies  Allergen Reactions  . Darvon [Propoxyphene] Swelling    Patient Measurements: Height: 6' (182.9 cm) Weight: 195 lb (88.451 kg) IBW/kg (Calculated) : 77.6 Adjusted Body Weight:   Vital Signs: Temp: 98.1 F (36.7 C) (02/10 0035) Temp src: Oral (02/10 0035) BP: 137/77 mmHg (02/10 0035) Pulse Rate: 77 (02/10 0035) Intake/Output from previous day: 02/09 0701 - 02/10 0700 In: 120 [P.O.:120] Out: -  Intake/Output from this shift: Total I/O In: 120 [P.O.:120] Out: -   Labs:  Recent Labs  03/29/13 2119  WBC 9.8  HGB 12.1*  PLT 254  CREATININE 1.42*   Estimated Creatinine Clearance: 43.3 ml/min (by C-G formula based on Cr of 1.42). No results found for this basename: VANCOTROUGH, VANCOPEAK, VANCORANDOM, GENTTROUGH, GENTPEAK, GENTRANDOM, TOBRATROUGH, TOBRAPEAK, TOBRARND, AMIKACINPEAK, AMIKACINTROU, AMIKACIN,  in the last 72 hours   Microbiology: No results found for this or any previous visit (from the past 720 hour(s)).  Medical History: Past Medical History  Diagnosis Date  . Gout   . Hypertension   . Gout   . Diabetes mellitus without complication     borderline a few years     Medications:  Anti-infectives   Start     Dose/Rate Route Frequency Ordered Stop   03/30/13 1800  vancomycin (VANCOCIN) IVPB 750 mg/150 ml premix     750 mg 150 mL/hr over 60 Minutes Intravenous Every 12 hours 03/30/13 0301     03/29/13 2345  vancomycin (VANCOCIN) 1,500 mg in sodium chloride 0.9 % 500 mL IVPB     1,500 mg 250 mL/hr over 120 Minutes Intravenous STAT 03/29/13 2338 03/30/13 0207   03/29/13 2300  vancomycin (VANCOCIN) 1,500 mg in sodium chloride 0.9 % 500 mL IVPB  Status:  Discontinued     1,500 mg 250 mL/hr over 120 Minutes Intravenous STAT 03/29/13 2252 03/30/13 0100   03/29/13 2130  vancomycin (VANCOCIN) 1,500 mg in sodium chloride 0.9 % 500 mL IVPB  Status:   Discontinued     1,500 mg 250 mL/hr over 120 Minutes Intravenous  Once 03/29/13 2106 03/29/13 2111   03/29/13 2115  cefTRIAXone (ROCEPHIN) 2 g in dextrose 5 % 50 mL IVPB  Status:  Discontinued     2 g 100 mL/hr over 30 Minutes Intravenous  Once 03/29/13 2106 03/29/13 2111     Assessment: Patient with osteomyelitis.  First dose of antibiotics already given.  Goal of Therapy:  Vancomycin trough level 15-20 mcg/ml  Plan:  Measure antibiotic drug levels at steady state Follow up culture results Vancomycin 750mg  iv q12hr, next dose at [address]  Fransheska Willingham Jr, RussellJulian Crowford 03/30/2013,3:02 AM

## 2013-03-30 NOTE — Progress Notes (Signed)
Patient admitted to room 1609 from ED. VSS. No complaints at present.

## 2013-03-31 ENCOUNTER — Inpatient Hospital Stay (HOSPITAL_COMMUNITY): Payer: Medicare Other

## 2013-03-31 DIAGNOSIS — D649 Anemia, unspecified: Secondary | ICD-10-CM

## 2013-03-31 DIAGNOSIS — M109 Gout, unspecified: Secondary | ICD-10-CM

## 2013-03-31 DIAGNOSIS — N183 Chronic kidney disease, stage 3 unspecified: Secondary | ICD-10-CM

## 2013-03-31 DIAGNOSIS — D72829 Elevated white blood cell count, unspecified: Secondary | ICD-10-CM

## 2013-03-31 LAB — SYNOVIAL CELL COUNT + DIFF, W/ CRYSTALS
Eosinophils-Synovial: 0 % (ref 0–1)
Eosinophils-Synovial: 0 % (ref 0–1)
LYMPHOCYTES-SYNOVIAL FLD: 1 % (ref 0–20)
Lymphocytes-Synovial Fld: 0 % (ref 0–20)
Monocyte-Macrophage-Synovial Fluid: 11 % — ABNORMAL LOW (ref 50–90)
Monocyte-Macrophage-Synovial Fluid: 16 % — ABNORMAL LOW (ref 50–90)
NEUTROPHIL, SYNOVIAL: 89 % — AB (ref 0–25)
Neutrophil, Synovial: 83 % — ABNORMAL HIGH (ref 0–25)
WBC, SYNOVIAL: 17700 /mm3 — AB (ref 0–200)
WBC, Synovial: UNDETERMINED /mm3 (ref 0–200)

## 2013-03-31 LAB — GLUCOSE, CAPILLARY
GLUCOSE-CAPILLARY: 150 mg/dL — AB (ref 70–99)
Glucose-Capillary: 145 mg/dL — ABNORMAL HIGH (ref 70–99)
Glucose-Capillary: 219 mg/dL — ABNORMAL HIGH (ref 70–99)
Glucose-Capillary: 251 mg/dL — ABNORMAL HIGH (ref 70–99)

## 2013-03-31 LAB — URIC ACID: Uric Acid, Serum: 9 mg/dL — ABNORMAL HIGH (ref 4.0–7.8)

## 2013-03-31 MED ORDER — COLCHICINE 0.6 MG PO TABS
1.2000 mg | ORAL_TABLET | Freq: Once | ORAL | Status: AC
  Administered 2013-03-31: 1.2 mg via ORAL
  Filled 2013-03-31: qty 2

## 2013-03-31 MED ORDER — TRIAMCINOLONE ACETONIDE 40 MG/ML IJ SUSP
40.0000 mg | Freq: Once | INTRAMUSCULAR | Status: DC
  Filled 2013-03-31: qty 1

## 2013-03-31 MED ORDER — COLCHICINE 0.6 MG PO TABS
0.6000 mg | ORAL_TABLET | Freq: Two times a day (BID) | ORAL | Status: DC
Start: 2013-03-31 — End: 2013-04-01
  Administered 2013-03-31 – 2013-04-01 (×2): 0.6 mg via ORAL
  Filled 2013-03-31 (×3): qty 1

## 2013-03-31 MED ORDER — LIDOCAINE HCL 1 % IJ SOLN
30.0000 mL | Freq: Once | INTRAMUSCULAR | Status: AC
  Administered 2013-03-31: 30 mL via INTRADERMAL
  Filled 2013-03-31: qty 30

## 2013-03-31 NOTE — Progress Notes (Addendum)
[facility] PROGRESS NOTE  Frank [name] BVQ:[id number] DOB: [birth year] DOA: 03/29/2013 PCP: Jobe Marker, MD  Assessment/Plan  Right second toe acute gouty arthritis versus infected Gout  - Orthopedic input appreciated.  -  Continue doxycycline  -  Followup with orthopedics in one week  -  Start colchicine -  Continue prednisone -  ESR 66, CRP 2.9 -  Uric acid -  Blood cultures no growth to date  Right knee pain with signs of arthritis, likely gouty, preventing patient from ambulating -  X-rays of the knees demonstrates effusion and swelling  -  Orthopedics to possibly perform aspiration with steroid injection, particularly of the right knee  -  PT evaluation   Hypertension, blood pressure stable, continue propranolol   Chronic kidney disease stage 3, creatinine stable   Mild normocytic anemia likely marrow suppression from acute inflammation  -  Repeat CBC in 2 week  Mild leukocytosis likely secondary to steroids, no sx to support PNA or UTI -  Repeat CBC in 2 weeks    Diet:  Diabetic  Access:  PIV  IVF:  Off  Proph:  Heparin   Code Status: DO NOT RESUSCITATE  Family Communication: Patient and his son-in-law  Disposition Plan: Pending improvement in pain, with the ability to ambulate and assist with transfers, likely to home probably with home health services as early as tomorrow    Consultants:  Orthopedics, Dr. Erlinda Hong  Procedures:  X-ray right foot  X-ray left knee  X-ray right knee   Antibiotics:  Vancomycin from 2/10x1 dose  Doxycycline 2/11   HPI/Subjective:  Swelling and pain of the second toe of the right foot is improved, and the toe is not draining as much currently. He now has severe right knee pain with swelling, warmth, and without redness. Pain is worse with movement and he is unable to bear weight on that leg.   Objective: Filed Vitals:   03/30/13 1400 03/30/13 2123 03/30/13 2135 03/31/13 0624  BP: 121/60  129/74 121/64  Pulse:  63 72 71 65  Temp: 98.5 F (36.9 C)  99.1 F (37.3 C) 98 F (36.7 C)  TempSrc: Oral  Oral Oral  Resp: $Remo'19  20 18  'MkRpk$ Height:      Weight:      SpO2: 96%  95% 95%    Intake/Output Summary (Last 24 hours) at 03/31/13 1421 Last data filed at 03/31/13 0900  Gross per 24 hour  Intake    858 ml  Output      0 ml  Net    858 ml   Filed Weights   03/29/13 2121 03/30/13 0035  Weight: 88.451 kg (195 lb) 88.451 kg (195 lb)    Exam:   General:  Caucasian male, No acute distress  HEENT:  NCAT, MMM  Cardiovascular:  RRR, nl S1, S2 no mrg, 2+ pulses, warm extremities  Respiratory:  CTAB, no increased WOB  Abdomen:   NABS, soft, NT/ND  MSK:   Normal tone and bulk, bilateral knees with crepitus and bony changes consistent with arthritis, right knee with a tense effusion and surrounding edema, no erythema, markedly warm compared to the left knee.  Range of motion limited by pain. Left knee with less swelling and effusion and warmth, but still somewhat painful with range of motion.  Second toe of the right foot is purplish and markedly swollen to about 4 times the normal size with a pinhead sized area that is leaking some whitish material on the fibular side of  the foot   Neuro:  Grossly intact  Data Reviewed: Basic Metabolic Panel:  Recent Labs Lab 03/29/13 2119 03/30/13 0440  NA 140 140  K 3.5* 3.7  CL 102 103  CO2 22 20  GLUCOSE 202* 165*  BUN 32* 27*  CREATININE 1.42* 1.31  CALCIUM 9.4 9.2   Liver Function Tests:  Recent Labs Lab 03/30/13 0440  AST 15  ALT 11  ALKPHOS 75  BILITOT 0.7  PROT 6.5  ALBUMIN 3.2*   No results found for this basename: LIPASE, AMYLASE,  in the last 168 hours No results found for this basename: AMMONIA,  in the last 168 hours CBC:  Recent Labs Lab 03/29/13 2119 03/30/13 0440  WBC 9.8 11.1*  NEUTROABS 6.4  --   HGB 12.1* 12.7*  HCT 35.4* 37.9*  MCV 94.1 93.8  PLT 254 272   Cardiac Enzymes: No results found for this basename:  CKTOTAL, CKMB, CKMBINDEX, TROPONINI,  in the last 168 hours BNP (last 3 results) No results found for this basename: PROBNP,  in the last 8760 hours CBG:  Recent Labs Lab 03/31/13 0723 03/31/13 1234  GLUCAP 145* 150*    Recent Results (from the past 240 hour(s))  CULTURE, BLOOD (ROUTINE X 2)     Status: None   Collection Time    03/30/13 12:02 AM      Result Value Ref Range Status   Specimen Description BLOOD RIGHT ANTECUBITAL   Final   Special Requests BOTTLES DRAWN AEROBIC AND ANAEROBIC 3CC   Final   Culture  Setup Time     Final   Value: 03/30/2013 03:28     Performed at [facility]   Culture     Final   Value:        BLOOD CULTURE RECEIVED NO GROWTH TO DATE CULTURE WILL BE HELD FOR 5 DAYS BEFORE ISSUING A FINAL NEGATIVE REPORT     Performed at [facility]   Report Status PENDING   Incomplete  CULTURE, BLOOD (ROUTINE X 2)     Status: None   Collection Time    03/30/13 12:02 AM      Result Value Ref Range Status   Specimen Description BLOOD RIGHT ANTECUBITAL   Final   Special Requests BOTTLES DRAWN AEROBIC AND ANAEROBIC 5CC   Final   Culture  Setup Time     Final   Value: 03/30/2013 03:30     Performed at [facility]   Culture     Final   Value:        BLOOD CULTURE RECEIVED NO GROWTH TO DATE CULTURE WILL BE HELD FOR 5 DAYS BEFORE ISSUING A FINAL NEGATIVE REPORT     Performed at [facility]   Report Status PENDING   Incomplete     Studies: Dg Knee Left Port  03/31/2013   CLINICAL DATA:  Bilateral knee pain for 1 week  EXAM: PORTABLE LEFT KNEE - 1-2 VIEW  COMPARISON:  None.  FINDINGS: Two views of left knee submitted. There is narrowing of medial joint compartment. Narrowing of patellofemoral joint space. Small joint effusion. Atherosclerotic calcifications of femoral and popliteal artery. No acute fracture or subluxation.  IMPRESSION: No acute fracture or subluxation. Degenerative changes as described above.   Electronically Signed    By: Lahoma Crocker M.D.   On: 03/31/2013 10:52   Dg Knee Right Port  03/31/2013   CLINICAL DATA:  Possible gout. Bilateral knee pain for the past week.  EXAM:  PORTABLE RIGHT KNEE - 1-2 VIEW  COMPARISON:  No priors.  FINDINGS: No acute displaced fracture, subluxation or dislocation. There is joint space narrowing, subchondral sclerosis and mild osteophyte formation in a tricompartmental distribution, compatible with mild osteoarthritis. No definite bony erosions are noted. However, there is profound soft tissue swelling superior and medial to the knee joint. Extensive atherosclerosis.  IMPRESSION: 1. Profound soft tissue swelling superior and medial to the knee joint. 2. No definite stigmata of gout are confidently identified on today's examination. 3. Mild tricompartmental osteoarthritis. 4. Severe atherosclerosis.   Electronically Signed   By: Vinnie Langton M.D.   On: 03/31/2013 10:51   Dg Foot Complete Right  03/29/2013   CLINICAL DATA:  Trauma 2-3 weeks ago with bruising and swelling to the second toe  EXAM: RIGHT FOOT COMPLETE - 3+ VIEW  COMPARISON:  None.  FINDINGS: Destruction of the head of the second toe proximal phalanx is identified with internal fracture lines evident. Fluffy ossification/ calcification overlying this area is identified. Extensive vascular calcifications are noted. Periarticular erosions with bony overgrowth noted predominantly at the first metatarsal-phalangeal joint. No radiopaque foreign body. Bones are subjectively osteopenic.  IMPRESSION: Osseous destruction at the right second PIP joint with fracture lines evident and findings highly suggestive of osteomyelitis.  Periarticular erosions most prominent at the right first metatarsal phalangeal joint as well as the first tarsometatarsal joint. This could be related to gout, neuropathic arthropathy, or multi focal osteomyelitis.   Electronically Signed   By: Conchita Paris M.D.   On: 03/29/2013 18:07    Scheduled Meds: .  colchicine  0.6 mg Oral BID  . doxycycline  100 mg Oral Q12H  . heparin  5,000 Units Subcutaneous 3 times per day  . insulin aspart  0-9 Units Subcutaneous TID WC  . pneumococcal 23 valent vaccine  0.5 mL Intramuscular Tomorrow-1000  . predniSONE  40 mg Oral Q breakfast  . propranolol  20 mg Oral BID  . triamcinolone acetonide  40 mg Intra-articular Once  . triamcinolone acetonide  40 mg Intra-articular Once   Continuous Infusions:   Principal Problem:   Osteomyelitis Active Problems:   Gout   Hypertension    Time spent: 30 min    Shanautica Forker, [facility] Pager [phone]. If 7PM-7AM, please contact night-coverage at [email or web address], password [facility] 03/31/2013, 2:21 PM  LOS: 2 days

## 2013-03-31 NOTE — Progress Notes (Addendum)
   Subjective:  Patient reports worsening pain in bilateral knees that feels like a gout flare up.  Toe feels much better.  Objective:   VITALS:   Filed Vitals:   03/30/13 1400 03/30/13 2123 03/30/13 2135 03/31/13 0624  BP: 121/60  129/74 121/64  Pulse: 63 72 71 65  Temp: 98.5 F (36.9 C)  99.1 F (37.3 C) 98 F (36.7 C)  TempSrc: Oral  Oral Oral  Resp: 19  20 18   Height:      Weight:      SpO2: 96%  95% 95%    Toe exam slightly improved. Bilateral knees - warm to touch - painful ROM   Lab Results  Component Value Date   WBC 11.1* 03/30/2013   HGB 12.7* 03/30/2013   HCT 37.9* 03/30/2013   MCV 93.8 03/30/2013   PLT 272 03/30/2013     Assessment/Plan:     Problem List Items Addressed This Visit     Cardiovascular and Mediastinum   Hypertension   Relevant Medications      lisinopril-hydrochlorothiazide (PRINZIDE,ZESTORETIC) 20-25 MG per tablet      propranolol (INDERAL) 20 MG tablet      propranolol (INDERAL) tablet 20 mg      heparin injection 5,000 Units     Musculoskeletal and Integument   *Osteomyelitis - Primary   Relevant Medications      pneumococcal 23 valent vaccine (PNU-IMMUNE) injection 0.5 mL (Start on 03/31/2013 10:00 AM)     Other   Gout      - Continue treatment of gout in toe, recommend colchicine but will leave it up to hospitalist ultimately - bilateral knee pain is c/w gout flare up, xray ordered - consider aspiration and possible steroid injection   Cheral AlmasXu, Zhamir Pirro Michael 03/31/2013, 7:49 AM [phone]

## 2013-03-31 NOTE — Progress Notes (Signed)
Bedside aspiration and steroid injection of both knees performed under sterile conditions. Aspirate sent off for crystals, cell count w/diff, cultures. Will follow cultures and clinical condition.  Frank ReelN. Michael Ainhoa Rallo, MD [facility] [phone] 5:39 PM

## 2013-03-31 NOTE — Treatment Plan (Signed)
Knee aspirate c/w acute gout attack. Recommend medical treatment of gout. F/u 1 week for toe.  Frank ReelN. Michael Deloma Spindle, MD [facility] [phone] 8:49 PM

## 2013-04-01 DIAGNOSIS — N183 Chronic kidney disease, stage 3 unspecified: Secondary | ICD-10-CM

## 2013-04-01 LAB — GLUCOSE, CAPILLARY
GLUCOSE-CAPILLARY: 171 mg/dL — AB (ref 70–99)
Glucose-Capillary: 180 mg/dL — ABNORMAL HIGH (ref 70–99)

## 2013-04-01 MED ORDER — HYDROCODONE-ACETAMINOPHEN 5-325 MG PO TABS: 1.0000 | ORAL_TABLET | Freq: Four times a day (QID) | ORAL | Status: AC | PRN

## 2013-04-01 MED ORDER — COLCHICINE 0.6 MG PO TABS: 0.6000 mg | ORAL_TABLET | Freq: Two times a day (BID) | ORAL | Status: AC

## 2013-04-01 MED ORDER — PREDNISONE 10 MG PO TABS: ORAL_TABLET | ORAL | Status: AC

## 2013-04-01 MED ORDER — DOXYCYCLINE HYCLATE 100 MG PO TABS: 100.0000 mg | ORAL_TABLET | Freq: Two times a day (BID) | ORAL | Status: AC

## 2013-04-01 NOTE — Progress Notes (Signed)
Patient feels much better. F/u next week for outpatient surgery of toe. May dc from ortho standpoint.  [name] [name]. Michael Sharlyn Odonnel, MD [facility] [phone] 7:36 AM

## 2013-04-01 NOTE — Discharge Summary (Addendum)
Physician Discharge Summary  [name] [name] HSU:[id number] DOB: [birth year] DOA: 03/29/2013  PCP: [name] Bachelor, MD  Admit date: 03/29/2013 Discharge date: 04/01/2013  Recommendations for Outpatient Follow-up:  1. Follow up with Dr. Roda [name] in 1 week.   2. Prednisone taper and colchicine at discharge.  Uric acid level 9. Recommend starting allopurinol or other gout maintenance medication as soon as this gout flare is complete.  Follow up pending blood cultures and bilateral knee aspirate cultures please.  Please repeat CBC and BMP to f/u leukocytosis, anemia, and kidney function in 2 weeks.  Screen for DM if not completed recently.    Discharge Diagnoses:  Principal Problem:   Osteomyelitis of second toe right foot Active Problems:   Gout   Hypertension   Acute gouty arthritis, bilateral knees   Normocytic anemia   CKD (chronic kidney disease) stage 3, GFR 30-59 ml/min   Leukocytosis, unspecified   Discharge Condition: stable, improved  Diet recommendation: diabetic  Wt Readings from Last 3 Encounters:  03/30/13 88.451 kg (195 lb)  03/30/13 88.451 kg (195 lb)    History of present illness:  [name] [name] is a 80 y.o. male with Past medical history of gout, hypertension, chronic kidney disease.  The patient is coming from home.  The patient was brought in by his daughter, as per the patient he has been having complaints of pain in his second toe since last 3 weeks. Since last one week she has noted white-yellow discharge coming out of the second toe. He initially felt that this was his routine gout attack and took his ibuprofen.  Few days ago he had bumped his toe and since then he has noted this discharge. He complains of generalized fatigue and tiredness but denies any fever, chills, headache, cough, chest pain, palpitation, shortness of breath, orthopnea, PND, nausea, vomiting, abdominal pain, diarrhea, constipation, active bleeding, burning urination, dizziness, pedal edema, focal  neurological deficit.  He denies any prior history of systemic infections or long-term antibiotics   Hospital Course:  Right second toe acute gouty arthritis and osteomyelitis.  X-ray of the foot demonstrated osseous destruction of the right second PIP joint with fracture lines and findings highly suggestive of osteomyelitis. Additionally there were some periarticular erosions at the right first metatarsophalangeal joint and first tarsometatarsal joint which are likely gouty.  ESR 66, CRP 2.9.  He was started on vancomycin and orthopedics was consulted.  Orthopedics agreed that clinical findings and XR findings were suggestive of osteomyelitis and recommended antibiotics and gout treatment for 1 week followed by amputation of the affected toe.  After initiation of steroids and colchicine as well as continued antibiotics, his toe inflammation and swelling dramatically improved.  He will continue colchicine and a quick prednisone taper and doxycycline at discharge.  Orthopedics follow up in one week.  Blood cultures are NGTD.    Bilateral knee pain with signs of gouty arthritis.  Symptoms occurred suddenly, and were severe enough that they prevented the patient from a bleeding. X-rays of the knees demonstrated effusions and swelling. Orthopedics performed joint aspiration with steroid injection of bilateral knees. Fluid was sent for analysis, and demonstrated intracellular crystals consistent with gout.  Specimens clotted so WBC could not be performed.  No organisms were seen on gram stain and cultures are pending at the time of discharge.  Knees improved dramatically after aspiration and patient was able to ambulate again.    Hypertension, blood pressure stable, continue propranolol   Chronic kidney disease stage 3, creatinine stable, no baseline  on record.     Mild normocytic anemia likely marrow suppression from acute inflammation.  Repeat CBC in 2 week   Mild leukocytosis likely secondary to  steroids and gout flare, no sx to support PNA or UTI.  Repeat CBC in 2 weeks   Consultants:  Orthopedics, Dr. Erlinda [name] Procedures:  X-ray right foot  X-ray left knee  X-ray right knee  Antibiotics:  Vancomycin from 2/10x1 dose  Doxycycline 2/11    Discharge Exam: Filed Vitals:   04/01/13 0509  BP: 114/68  Pulse: 63  Temp: 98.1 F (36.7 C)  Resp: 16   Filed Vitals:   03/31/13 0624 03/31/13 1350 03/31/13 2154 04/01/13 0509  BP: 121/64 109/46 106/68 114/68  Pulse: 65 62 69 63  Temp: 98 F (36.7 C) 98.5 F (36.9 C) 98.3 F (36.8 C) 98.1 F (36.7 C)  TempSrc: Oral  Oral Oral  Resp: $Remo'18 16 16 16  'kkKSs$ Height:      Weight:      SpO2: 95% 92% 93% 93%    General: Caucasian male, No acute distress  HEENT: NCAT, MMM  Cardiovascular: RRR, nl S1, S2, 2/6 systolic murmur left sternal border, 2+ pulses, warm extremities  Respiratory: CTAB, no increased WOB  Abdomen: NABS, soft, NT/ND  MSK: Normal tone and bulk, bilateral knees with crepitus.  Warmth, erythema, and swelling are dramatically improved since yesterday. Second toe of the right foot is purplish and swollen but considerably less erythematous.   Neuro: Grossly intact   Discharge Instructions      Discharge Orders   Future Orders Complete By Expires   Call MD for:  difficulty breathing, headache or visual disturbances  As directed    Call MD for:  extreme fatigue  As directed    Call MD for:  hives  As directed    Call MD for:  persistant dizziness or light-headedness  As directed    Call MD for:  persistant nausea and vomiting  As directed    Call MD for:  redness, tenderness, or signs of infection (pain, swelling, redness, odor or green/yellow discharge around incision site)  As directed    Call MD for:  severe uncontrolled pain  As directed    Call MD for:  temperature >100.4  As directed    Diet Carb Modified  As directed    Discharge instructions  As directed    Comments:     You were hospitalized with pain and  swelling of the second toe of your right foot.  Your X-rays and bloodwork suggested that you may have bone and joint infection of that toe called osteomyelitis.  You have been started on antibiotics, which you should continue twice daily until you follow up with orthopedics for toe amputation in approximately one week.  Please talk to Dr. Erlinda [name] about how long to continue antibiotics after amputation.  You also have gout.  Please take colchicine twice daily and a prednisone course to help your toe and your knees recover.  Once you are off your steroids, talk to your primary care doctor about starting allopurinol to prevent gout attacks, which can be dosed according to your kidney function.  Please STOP indocin and do not take naprosyn, aleve, ibuprofen, indocin, goody's powders, as these medications can be harmful to the kidneys.  Please stop your lisinopril-HCTZ medication until you follow up with your primary care doctor.   Driving Restrictions  As directed    Comments:     Do not drive or operate  heavy machinery while taking narcotic pain medication.   Increase activity slowly  As directed        Medication List    STOP taking these medications       indomethacin 25 MG capsule  Commonly known as:  INDOCIN     lisinopril-hydrochlorothiazide 20-25 MG per tablet  Commonly known as:  PRINZIDE,ZESTORETIC      TAKE these medications       colchicine 0.6 MG tablet  Take 1 tablet (0.6 mg total) by mouth 2 (two) times daily.     doxycycline 100 MG tablet  Commonly known as:  VIBRA-TABS  Take 1 tablet (100 mg total) by mouth every 12 (twelve) hours.     HYDROcodone-acetaminophen 5-325 MG per tablet  Commonly known as:  NORCO/VICODIN  Take 1-2 tablets by mouth every 6 (six) hours as needed for severe pain.     predniSONE 10 MG tablet  Commonly known as:  DELTASONE  Take 4 tabs daily x 2 days, 3 tabs daily x 2 days, 2 tabs daily x 2 days, 1 tab daily x 2 days, then stop     propranolol 20 MG  tablet  Commonly known as:  INDERAL  Take 20 mg by mouth 2 (two) times daily.     TART CHERRY ADVANCED Caps  Take 1 capsule by mouth daily.       Follow-up Information   Follow up with Marianna Payment, MD In 1 week.   Specialty:  Orthopedic Surgery   Contact information:   [place] [address] [phone]       Follow up with BEASLEY,CHARLES, MD. Schedule an appointment as soon as possible for a visit in 2 weeks.   Specialty:  Internal Medicine   Contact information:   [address] [place] [address] [phone]        The results of significant diagnostics from this hospitalization (including imaging, microbiology, ancillary and laboratory) are listed below for reference.    Significant Diagnostic Studies: Dg Knee Left Port  03/31/2013   CLINICAL DATA:  Bilateral knee pain for 1 week  EXAM: PORTABLE LEFT KNEE - 1-2 VIEW  COMPARISON:  None.  FINDINGS: Two views of left knee submitted. There is narrowing of medial joint compartment. Narrowing of patellofemoral joint space. Small joint effusion. Atherosclerotic calcifications of femoral and popliteal artery. No acute fracture or subluxation.  IMPRESSION: No acute fracture or subluxation. Degenerative changes as described above.   Electronically Signed   By: Lahoma Crocker M.D.   On: 03/31/2013 10:52   Dg Knee Right Port  03/31/2013   CLINICAL DATA:  Possible gout. Bilateral knee pain for the past week.  EXAM: PORTABLE RIGHT KNEE - 1-2 VIEW  COMPARISON:  No priors.  FINDINGS: No acute displaced fracture, subluxation or dislocation. There is joint space narrowing, subchondral sclerosis and mild osteophyte formation in a tricompartmental distribution, compatible with mild osteoarthritis. No definite bony erosions are noted. However, there is profound soft tissue swelling superior and medial to the knee joint. Extensive atherosclerosis.  IMPRESSION: 1. Profound soft tissue swelling superior and medial to the  knee joint. 2. No definite stigmata of gout are confidently identified on today's examination. 3. Mild tricompartmental osteoarthritis. 4. Severe atherosclerosis.   Electronically Signed   By: Vinnie Langton M.D.   On: 03/31/2013 10:51   Dg Foot Complete Right  03/29/2013   CLINICAL DATA:  Trauma 2-3 weeks ago with bruising and swelling to the second toe  EXAM: RIGHT FOOT COMPLETE - 3+ VIEW  COMPARISON:  None.  FINDINGS: Destruction of the head of the second toe proximal phalanx is identified with internal fracture lines evident. Fluffy ossification/ calcification overlying this area is identified. Extensive vascular calcifications are noted. Periarticular erosions with bony overgrowth noted predominantly at the first metatarsal-phalangeal joint. No radiopaque foreign body. Bones are subjectively osteopenic.  IMPRESSION: Osseous destruction at the right second PIP joint with fracture lines evident and findings highly suggestive of osteomyelitis.  Periarticular erosions most prominent at the right first metatarsal phalangeal joint as well as the first tarsometatarsal joint. This could be related to gout, neuropathic arthropathy, or multi focal osteomyelitis.   Electronically Signed   By: Conchita Paris M.D.   On: 03/29/2013 18:07    Microbiology: Recent Results (from the past 240 hour(s))  CULTURE, BLOOD (ROUTINE X 2)     Status: None   Collection Time    03/30/13 12:02 AM      Result Value Ref Range Status   Specimen Description BLOOD RIGHT ANTECUBITAL   Final   Special Requests BOTTLES DRAWN AEROBIC AND ANAEROBIC 3CC   Final   Culture  Setup Time     Final   Value: 03/30/2013 03:28     Performed at [facility]   Culture     Final   Value:        BLOOD CULTURE RECEIVED NO GROWTH TO DATE CULTURE WILL BE HELD FOR 5 DAYS BEFORE ISSUING A FINAL NEGATIVE REPORT     Performed at [facility]   Report Status PENDING   Incomplete  CULTURE, BLOOD (ROUTINE X 2)     Status: None    Collection Time    03/30/13 12:02 AM      Result Value Ref Range Status   Specimen Description BLOOD RIGHT ANTECUBITAL   Final   Special Requests BOTTLES DRAWN AEROBIC AND ANAEROBIC 5CC   Final   Culture  Setup Time     Final   Value: 03/30/2013 03:30     Performed at [facility]   Culture     Final   Value:        BLOOD CULTURE RECEIVED NO GROWTH TO DATE CULTURE WILL BE HELD FOR 5 DAYS BEFORE ISSUING A FINAL NEGATIVE REPORT     Performed at [facility]   Report Status PENDING   Incomplete  BODY FLUID CULTURE     Status: None   Collection Time    03/31/13  5:30 PM      Result Value Ref Range Status   Specimen Description SYNOVIAL LEFT KNEE JOINT   Final   Special Requests Normal   Final   Gram Stain     Final   Value: FEW WBC PRESENT,BOTH PMN AND MONONUCLEAR     NO ORGANISMS SEEN     Performed at [facility]   Culture     Final   Value: NO GROWTH 1 DAY     Performed at [facility]   Report Status PENDING   Incomplete  BODY FLUID CULTURE     Status: None   Collection Time    03/31/13  5:30 PM      Result Value Ref Range Status   Specimen Description SYNOVIAL LEFT KNEE JOINT   Final   Special Requests NONE   Final   Gram Stain     Final   Value: MODERATE WBC PRESENT, PREDOMINANTLY PMN     NO  ORGANISMS SEEN     Performed at [facility]   Culture     Final   Value: NO GROWTH 1 DAY     Performed at [facility]   Report Status PENDING   Incomplete     Labs: Basic Metabolic Panel:  Recent Labs Lab 03/29/13 2119 03/30/13 0440  NA 140 140  K 3.5* 3.7  CL 102 103  CO2 22 20  GLUCOSE 202* 165*  BUN 32* 27*  CREATININE 1.42* 1.31  CALCIUM 9.4 9.2   Liver Function Tests:  Recent Labs Lab 03/30/13 0440  AST 15  ALT 11  ALKPHOS 75  BILITOT 0.7  PROT 6.5  ALBUMIN 3.2*   No results found for this basename: LIPASE, AMYLASE,  in the last 168 hours No results found for this basename: AMMONIA,  in the last 168  hours CBC:  Recent Labs Lab 03/29/13 2119 03/30/13 0440  WBC 9.8 11.1*  NEUTROABS 6.4  --   HGB 12.1* 12.7*  HCT 35.4* 37.9*  MCV 94.1 93.8  PLT 254 272   Cardiac Enzymes: No results found for this basename: CKTOTAL, CKMB, CKMBINDEX, TROPONINI,  in the last 168 hours BNP: BNP (last 3 results) No results found for this basename: PROBNP,  in the last 8760 hours CBG:  Recent Labs Lab 03/31/13 1234 03/31/13 1751 03/31/13 2153 04/01/13 0715 04/01/13 1229  GLUCAP 150* 219* 251* 171* 180*    Time coordinating discharge: 45 minutes  Signed:  Willy Vorce  [facility] 04/01/2013, 8:02 PM

## 2013-04-01 NOTE — Evaluation (Addendum)
Physical Therapy Evaluation-1x Patient Details Name: [name] [name] MRN: [id number] DOB: Jan 20, 1928 Today's Date: 04/01/2013 Time: [id number] PT Time Calculation (min): 11 min  PT Assessment / Plan / Recommendation History of Present Illness  80 yo male admitted with osteomyelitis 2nd R toe, gout flare bil knees. Hx of gout, HTN, DM  Clinical Impression  On eval, pt required Min guard assist for mobility-abel to ambulate ~200 feet with walker. Demonstrates some mild unsteadiness and antalgic gait pattern R LE but no LOB or significant difficulty ambulating. No follow up PT needs at this time. Recommend 24/7 supervision initially for a few days-daughter present and states she can be with pt if needed. Ready to d/c home from PT standpoint     PT Assessment  Patent does not need any further PT services    Follow Up Recommendations  No PT follow up;Supervision/Assistance - 24 hour    Does the patient have the potential to tolerate intense rehabilitation      Barriers to Discharge        Equipment Recommendations  None recommended by PT    Recommendations for Other Services     Frequency      Precautions / Restrictions Precautions Precautions: Fall Restrictions Weight Bearing Restrictions: No   Pertinent Vitals/Pain 3/10 R LE with activity      Mobility  Bed Mobility Overal bed mobility: Modified Independent Transfers Overall transfer level: Needs assistance Transfers: Sit to/from Stand Sit to Stand: Supervision General transfer comment: supervision for safety Ambulation/Gait Ambulation/Gait assistance: Min guard Ambulation Distance (Feet): 200 Feet Assistive device: Rolling walker (2 wheeled) Gait Pattern/deviations: Step-through pattern;Antalgic General Gait Details: Noted mild antalgic gait patern-R LE. VCs safety, pacing.  Stairs: Yes Stair Management: Two rails;Step to pattern;Forwards Number of Stairs: 2 General stair comments: VCs safety, sequence. close guard  for safety    Exercises     PT Diagnosis:    PT Problem List:   PT Treatment Interventions:       PT Goals(Current goals can be found in the care plan section) Acute Rehab PT Goals Patient Stated Goal: home soon PT Goal Formulation: No goals set, d/c therapy  Visit Information  Last PT Received On: 04/01/13 Assistance Needed: +1 History of Present Illness: 80 yo male admitted with osteomyelitis 2nd R toe, gout flare bil knees. Hx of gout, HTN, DM       Prior Functioning  Home Living Family/patient expects to be discharged to:: Private residence Living Arrangements: Alone Type of Home: House Home Access: Stairs to enter Entergy CorporationEntrance Stairs-Number of Steps: 3  Entrance Stairs-Rails: Right;Left;Can reach both Home Layout: One level Home Equipment: Walker - 2 wheels;Walker - 4 wheels;Wheelchair - manual Prior Function Level of Independence: Independent Communication Communication: No difficulties    Cognition  Cognition Arousal/Alertness: Awake/alert Behavior During Therapy: WFL for tasks assessed/performed Overall Cognitive Status: Within Functional Limits for tasks assessed    Extremity/Trunk Assessment Upper Extremity Assessment Upper Extremity Assessment: Overall WFL for tasks assessed Lower Extremity Assessment Lower Extremity Assessment: Generalized weakness Cervical / Trunk Assessment Cervical / Trunk Assessment: Normal   Balance    End of Session PT - End of Session Activity Tolerance: Patient tolerated treatment well Patient left: in bed;with call bell/phone within reach;with family/visitor present  GP     Rebeca AlertJannie Liseth Wann, MPT Pager: [phone]

## 2013-04-01 NOTE — Care Management Note (Signed)
    Page 1 of 1   04/01/2013     12:24:26 PM   CARE MANAGEMENT NOTE 04/01/2013  Patient:  [name] [name]   Account Number:  [id number]  Date Initiated:  04/01/2013  Documentation initiated by:  [name] [name]  Subjective/Objective Assessment:   dx rt 2nd toe infection; gout     Action/Plan:   CM spokewith patient. Plans are [place] family members to assist with his care. States he has PCP -Dr [name] [name] in home town. [place] [facility] [place].   Anticipated DC Date:  04/01/2013   Anticipated DC Plan:  HOME/SELF CARE      DC Planning Services  CM consult      Choice offered to / List presented to:             Status of service:  Completed, signed off Medicare Important Message given?  NA - LOS <3 / Initial given by admissions (If response is "NO", the following Medicare IM given date fields will be blank) Date Medicare IM given:   Date Additional Medicare IM given:    Discharge Disposition:    Per UR Regulation:    If discussed at Long Length of Stay Meetings, dates discussed:    Comments:

## 2013-04-01 NOTE — Progress Notes (Signed)
Pt stable, scripts, d/c instructions given with no questions/concerns voiced by pt or daughter.  Pt transported via wheelchair to private vehicle by NT and daughter. 

## 2013-04-01 NOTE — Progress Notes (Signed)
Discharge summary sent to payer through [facility]  

## 2013-04-03 LAB — BODY FLUID CULTURE
CULTURE: NO GROWTH
Culture: NO GROWTH
SPECIAL REQUESTS: NORMAL

## 2013-04-05 LAB — CULTURE, BLOOD (ROUTINE X 2)
CULTURE: NO GROWTH
Culture: NO GROWTH

## 2015-01-19 DEATH — deceased

## 2015-10-14 IMAGING — CR DG KNEE 1-2V PORT*R*
1 series · 2 of 2 positions shown · non-contrast
Comparison: No priors.

CLINICAL DATA: Possible gout. Bilateral knee pain for the past
week.

EXAM:
PORTABLE RIGHT KNEE - 1-2 VIEW

[Series 1: AP · right · 2 of 2 slices shown]
[im 1/2]
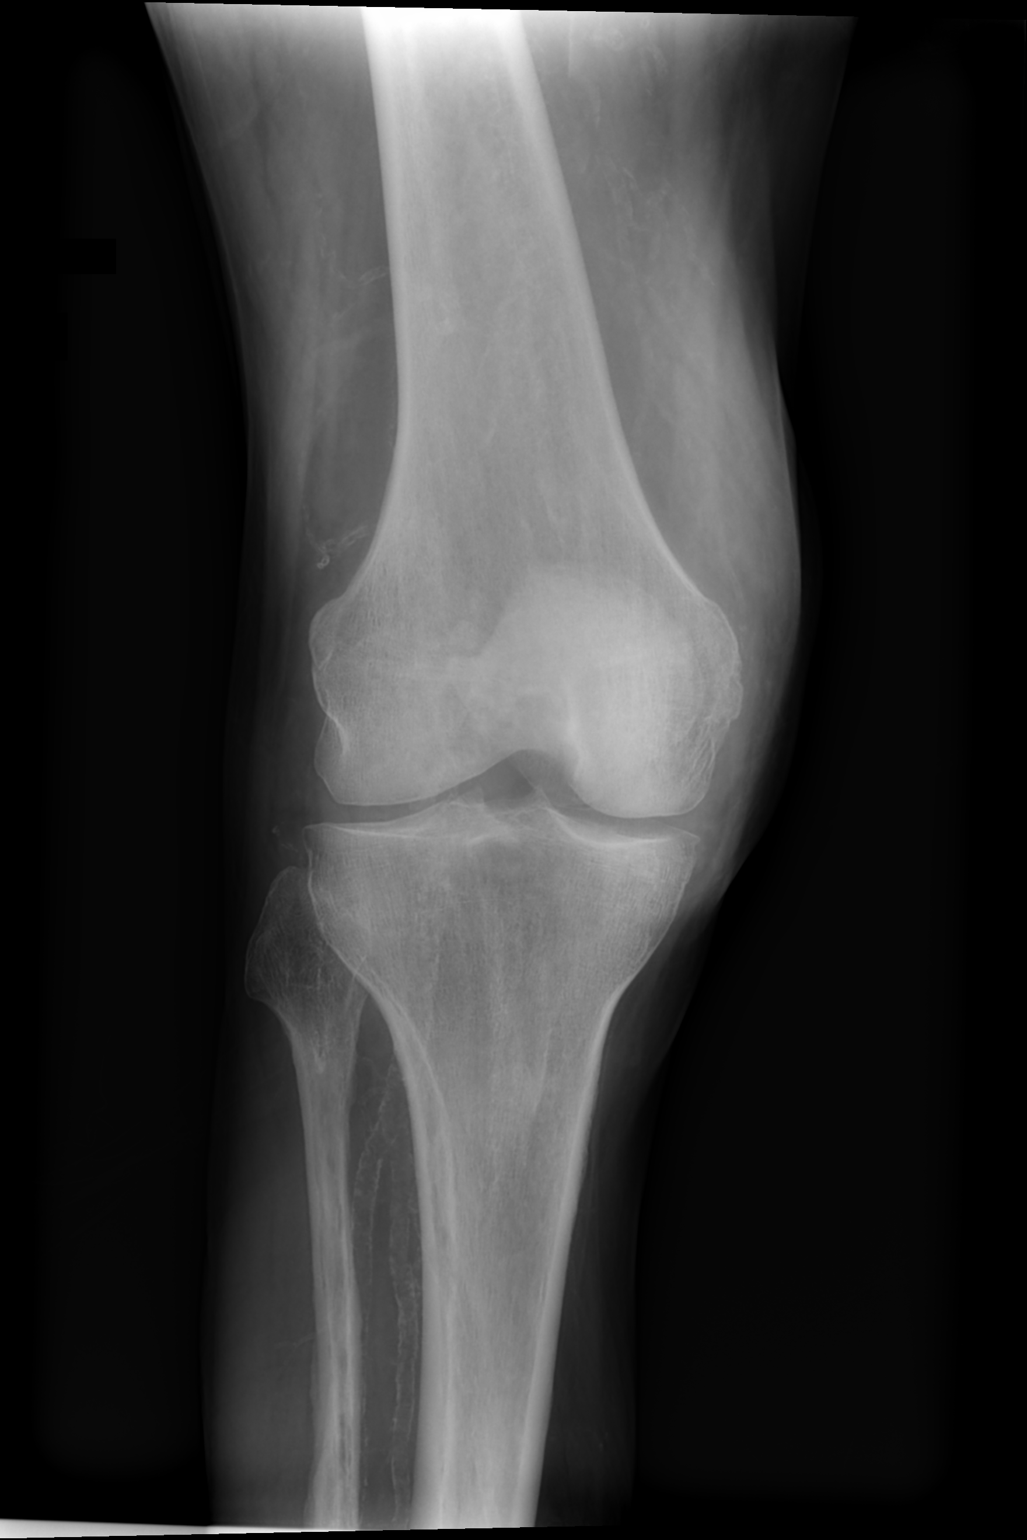
[im 2/2]
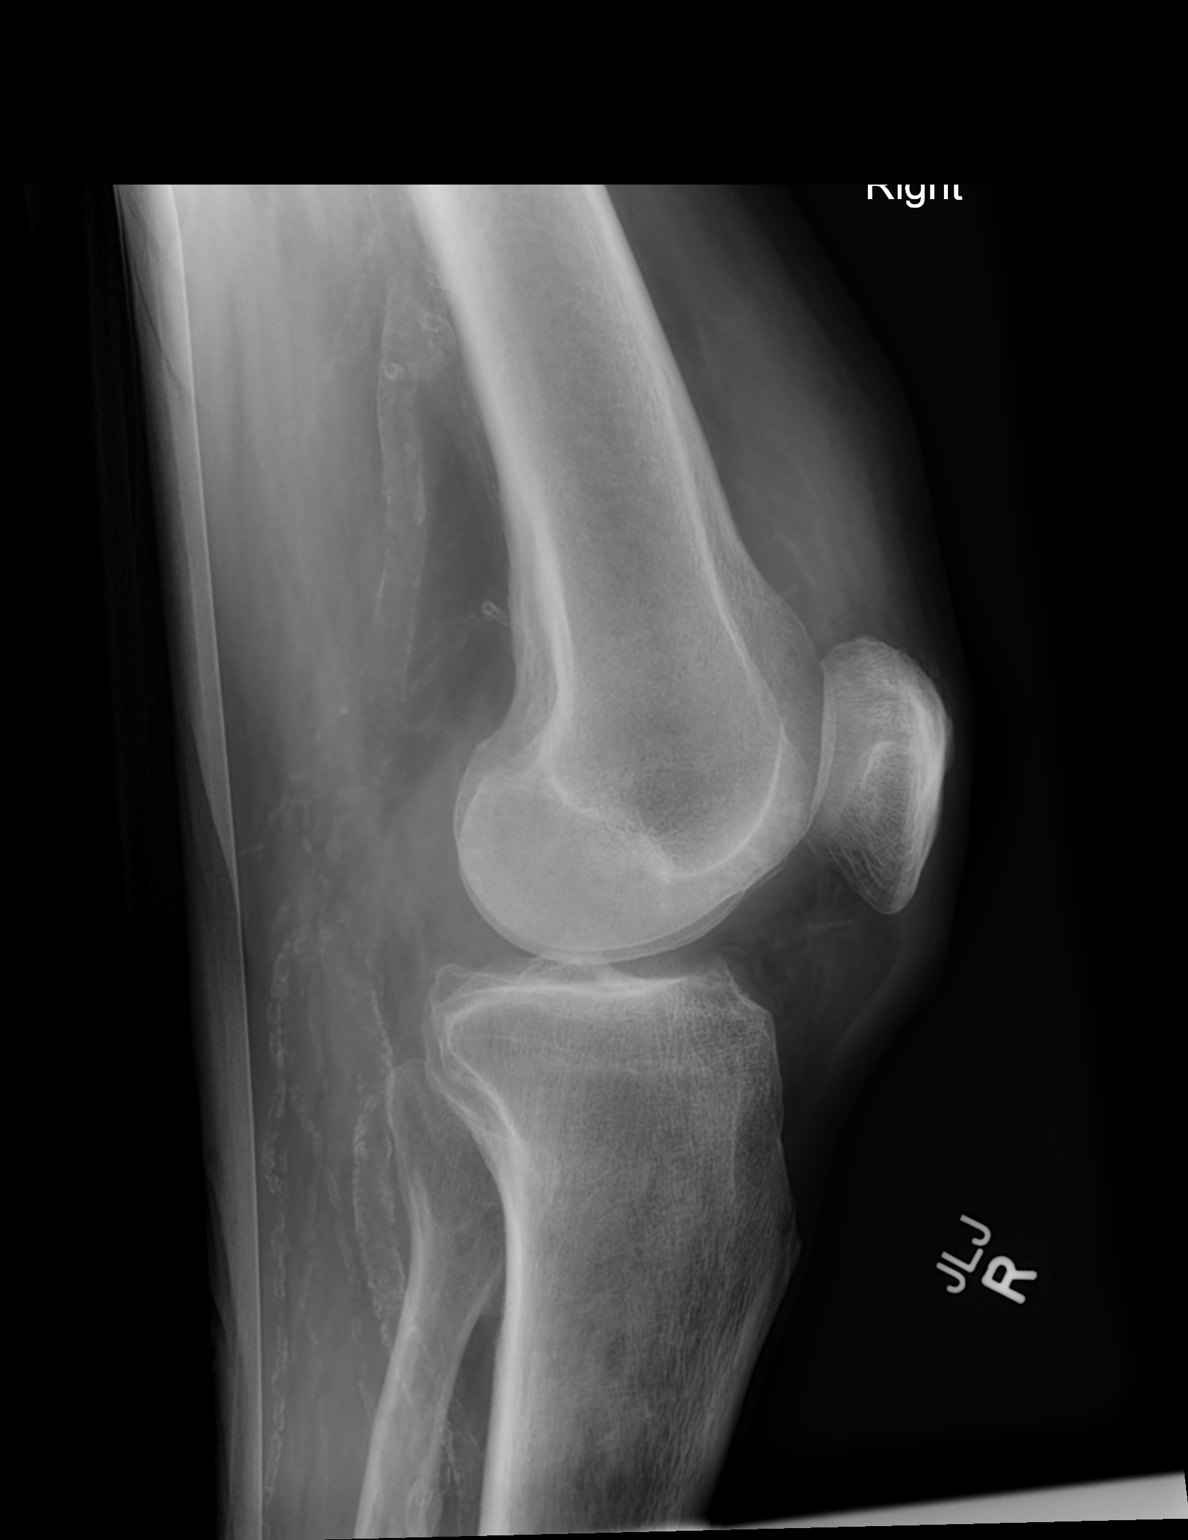

[2 of 2 positions shown; findings below may reference images not displayed]

FINDINGS: No acute displaced fracture, subluxation or dislocation. There is
joint space narrowing, subchondral sclerosis and mild osteophyte
formation in a tricompartmental distribution, compatible with mild
osteoarthritis. No definite bony erosions are noted. However, there
is profound soft tissue swelling superior and medial to the knee
joint. Extensive atherosclerosis.
IMPRESSION: 1. Profound soft tissue swelling superior and medial to the knee
joint.
2. No definite stigmata of gout are confidently identified on
today's examination.
3. Mild tricompartmental osteoarthritis.
4. Severe atherosclerosis.

## 2015-10-14 IMAGING — CR DG KNEE 1-2V PORT*L*
1 series · 2 of 2 positions shown · non-contrast
Comparison: None.

CLINICAL DATA: Bilateral knee pain for 1 week

EXAM:
PORTABLE LEFT KNEE - 1-2 VIEW

[Series 1: AP · left · 2 of 2 slices shown]
[im 1/2]
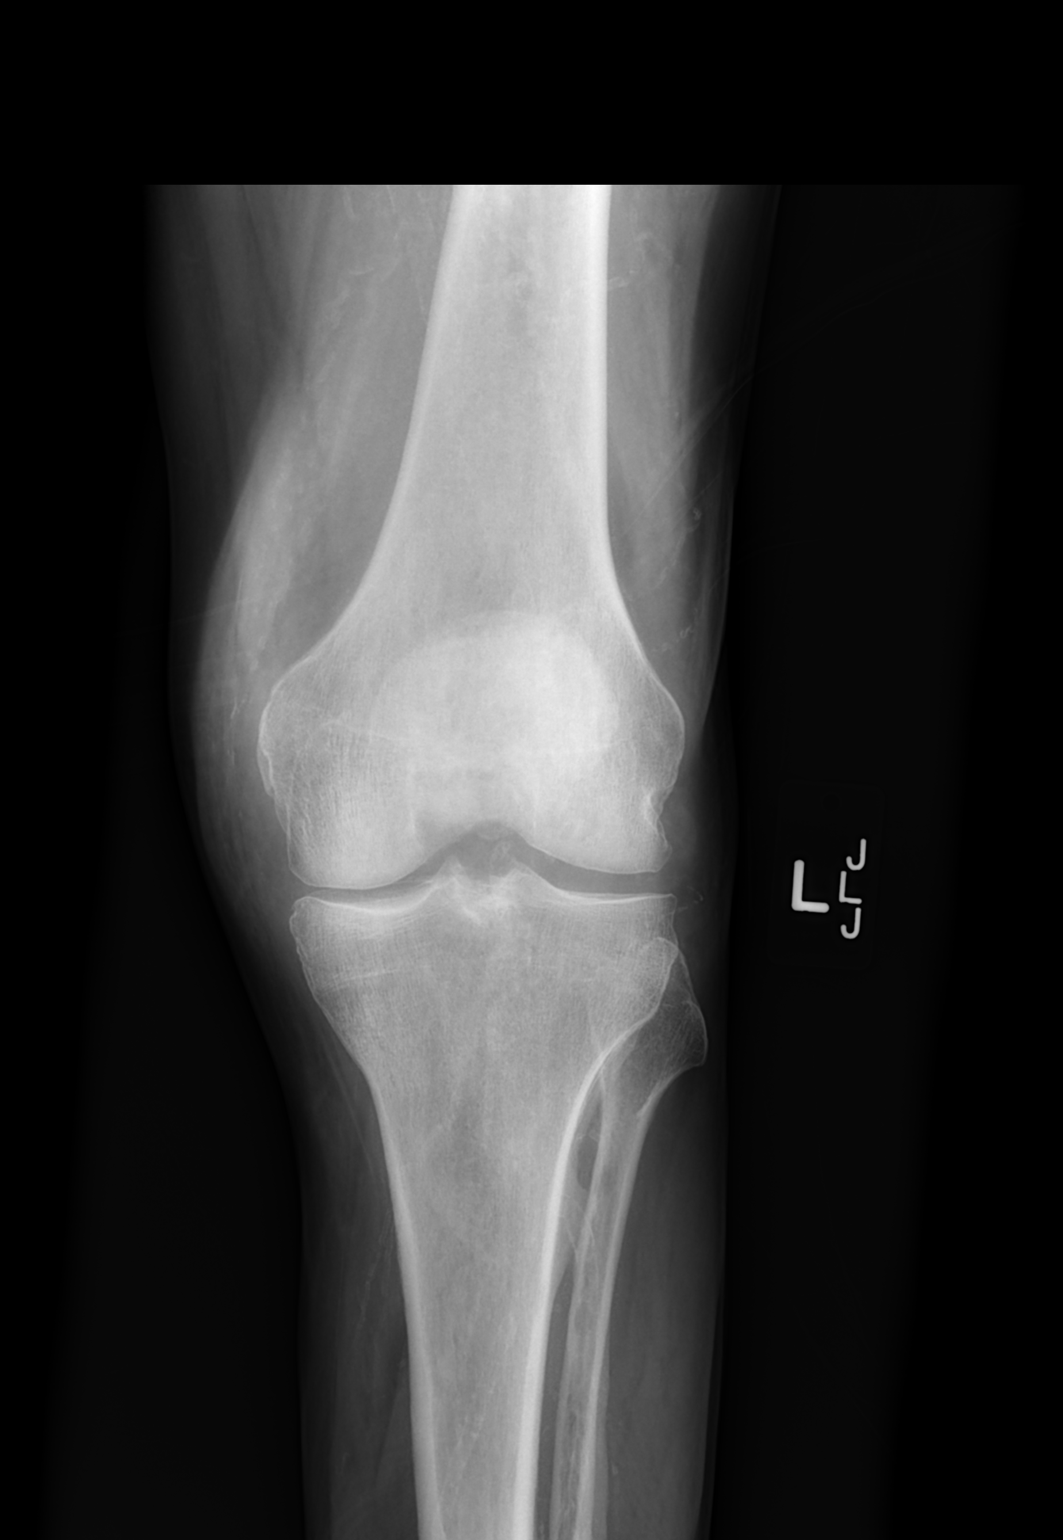
[im 2/2]
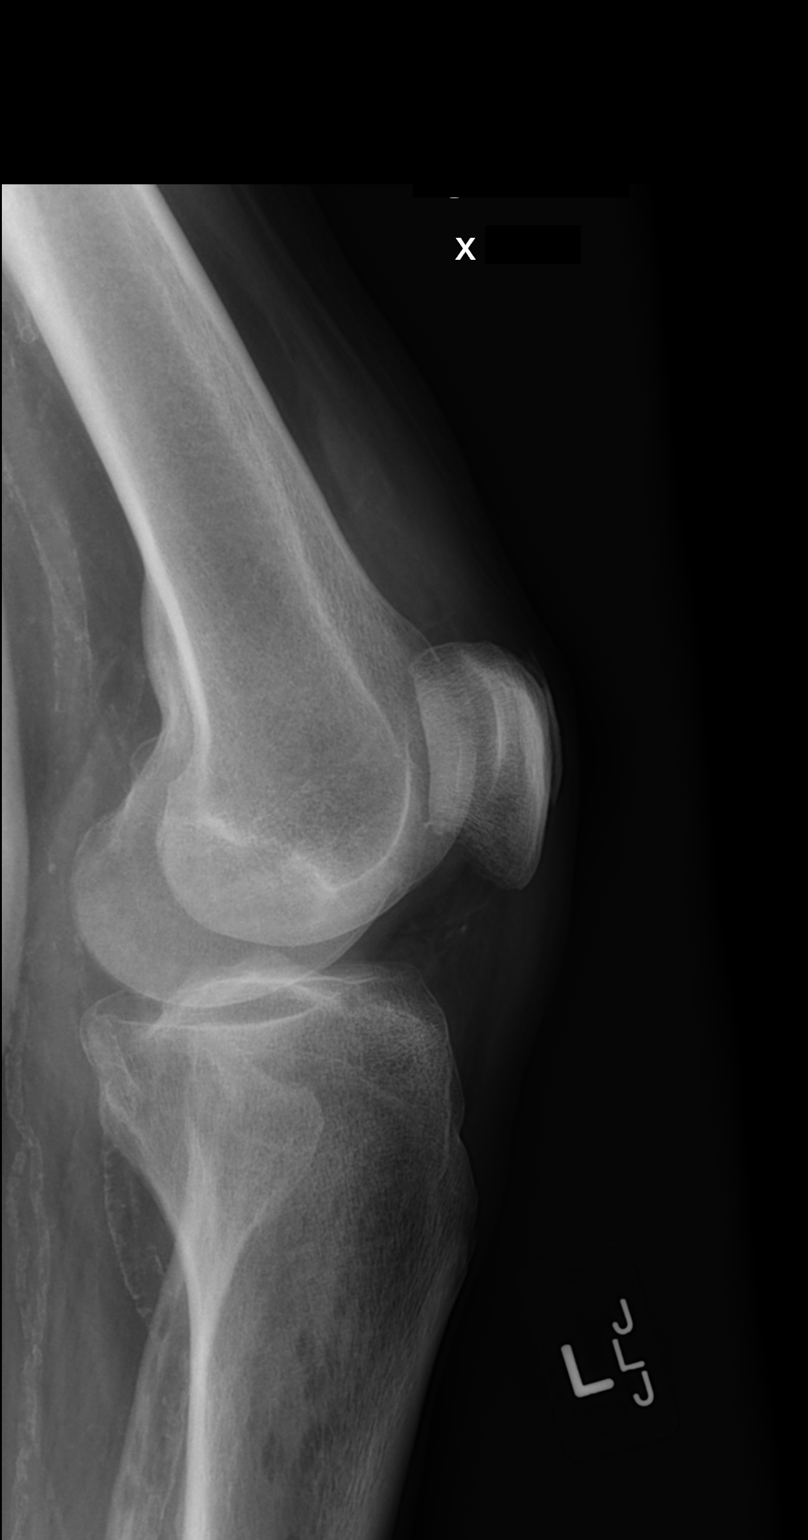

[2 of 2 positions shown; findings below may reference images not displayed]

FINDINGS: Two views of left knee submitted. There is narrowing of medial joint
compartment. Narrowing of patellofemoral joint space. Small joint
effusion. Atherosclerotic calcifications of femoral and popliteal
artery. No acute fracture or subluxation.
IMPRESSION: No acute fracture or subluxation. Degenerative changes as described
above.
